# Patient Record
Sex: Female | Born: 2000 | Race: White | Hispanic: No | Marital: Single | Smoking: Never smoker
Health system: Southern US, Community
[De-identification: ages and names within clinical notes are randomized; demographics above are authoritative.]

## PROBLEM LIST (undated history)

## (undated) DIAGNOSIS — F909 Attention-deficit hyperactivity disorder, unspecified type: Secondary | ICD-10-CM

## (undated) DIAGNOSIS — D649 Anemia, unspecified: Secondary | ICD-10-CM

## (undated) HISTORY — PX: MOLE REMOVAL: SHX2046

## (undated) HISTORY — DX: Anemia, unspecified: D64.9

---
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
## 2020-02-11 ENCOUNTER — Other Ambulatory Visit: Payer: Self-pay

## 2020-02-11 DIAGNOSIS — N944 Primary dysmenorrhea: Secondary | ICD-10-CM

## 2020-02-11 DIAGNOSIS — N920 Excessive and frequent menstruation with regular cycle: Secondary | ICD-10-CM

## 2020-02-11 MED ORDER — LO LOESTRIN FE 1 MG-10 MCG / 10 MCG PO TABS: 1.0000 | ORAL_TABLET | Freq: Every day | ORAL | 6 refills | Status: DC

## 2020-03-09 IMAGING — DX DG FOOT COMPLETE 3+V*L*
3 series · 3 of 3 positions shown · non-contrast
Comparison: None.

CLINICAL DATA: Chronic pain

EXAM:
LEFT FOOT - COMPLETE 3+ VIEW

[foot ap]
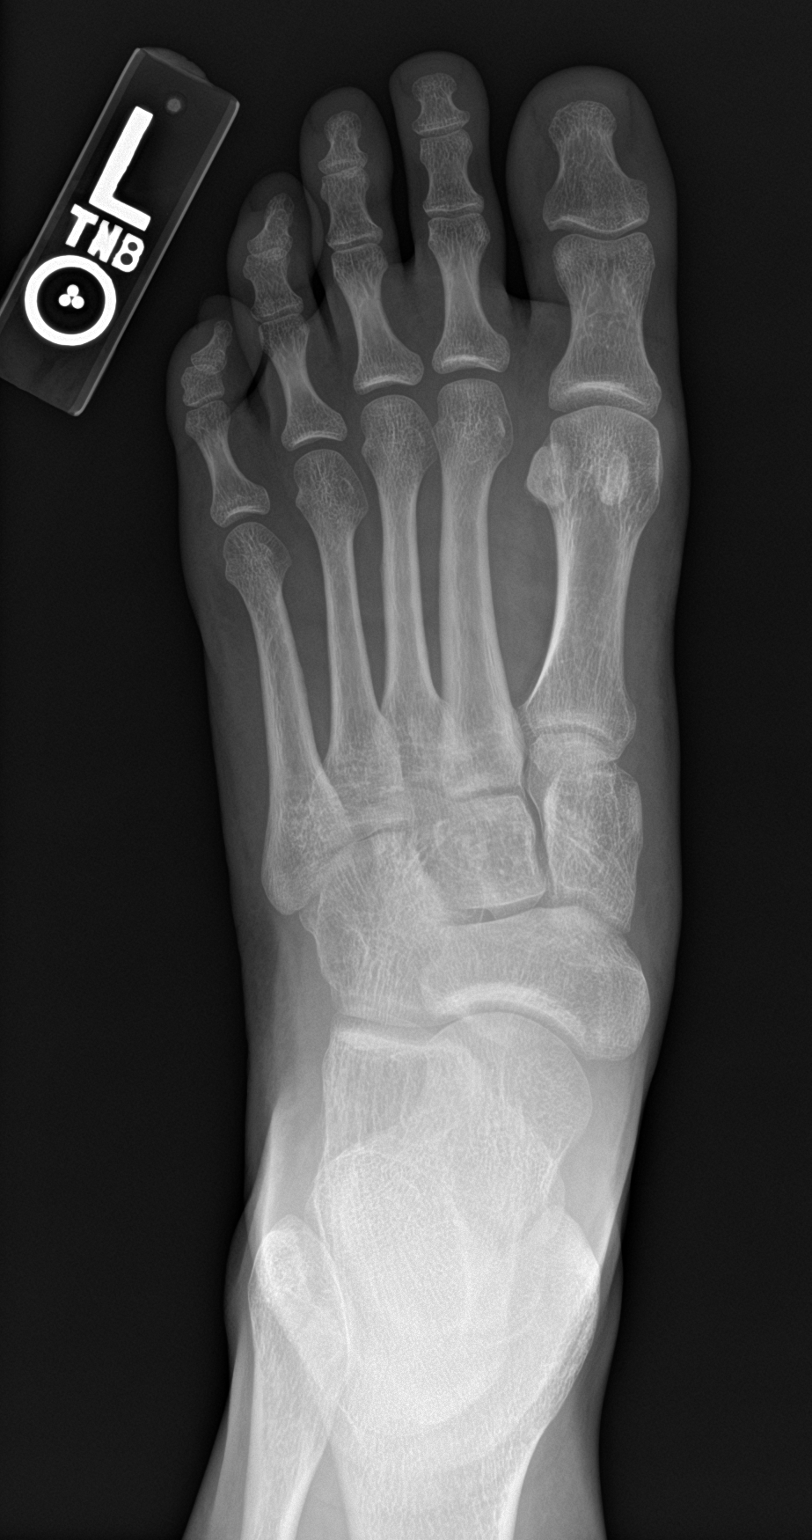

[foot obl]
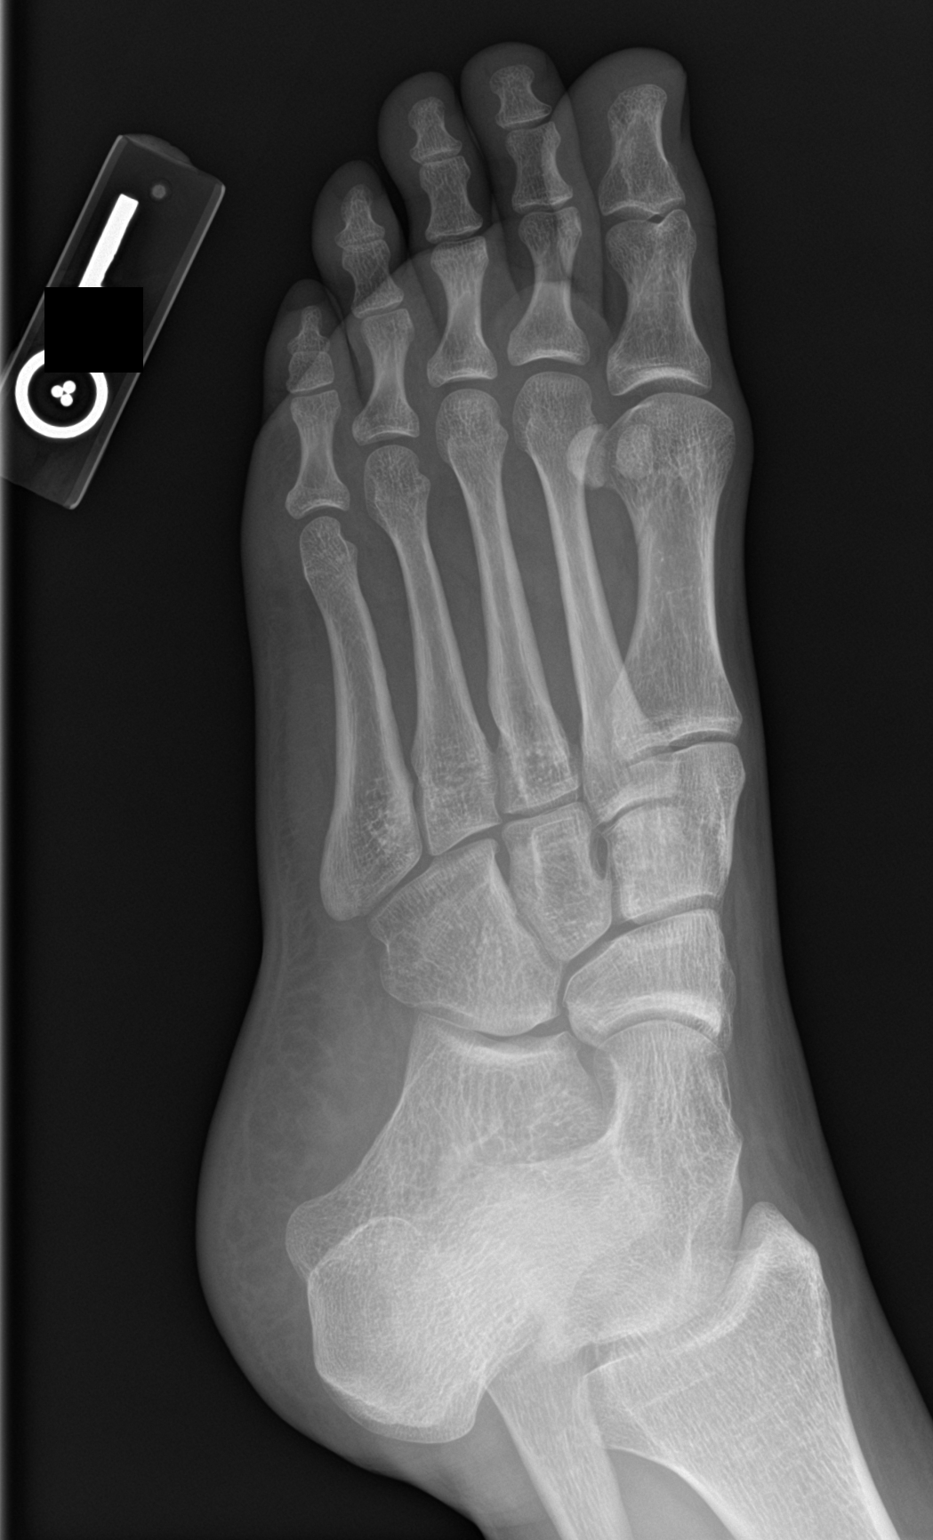

[foot lat]
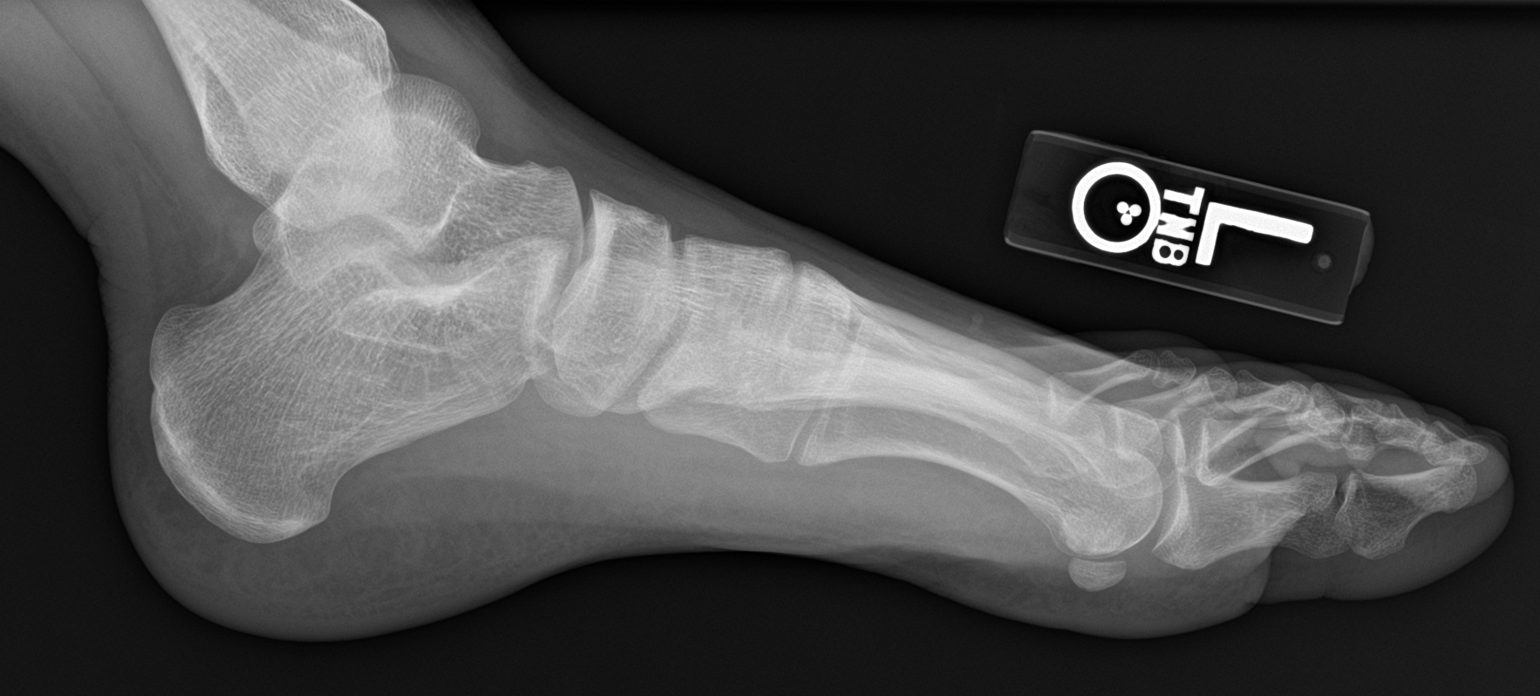

[3 of 3 positions shown; findings below may reference images not displayed]

FINDINGS: Frontal, oblique, and lateral views obtained. No fracture or
dislocation. Joint spaces appear normal. No erosive change.
IMPRESSION: No fracture or dislocation.  No evident arthropathy.

## 2022-10-02 ENCOUNTER — Ambulatory Visit (HOSPITAL_COMMUNITY)
Admission: EM | Admit: 2022-10-02 | Discharge: 2022-10-02 | Disposition: A | Discharge status: Home / Self Care | Payer: BC Managed Care – PPO | Attending: Internal Medicine | Admitting: Internal Medicine

## 2022-10-02 ENCOUNTER — Encounter (HOSPITAL_COMMUNITY): Payer: Self-pay | Admitting: *Deleted

## 2022-10-02 DIAGNOSIS — J029 Acute pharyngitis, unspecified: Secondary | ICD-10-CM | POA: Insufficient documentation

## 2022-10-02 DIAGNOSIS — B349 Viral infection, unspecified: Secondary | ICD-10-CM | POA: Insufficient documentation

## 2022-10-02 LAB — POCT RAPID STREP A (OFFICE): Rapid Strep A Screen: NEGATIVE

## 2022-10-02 MED ORDER — LIDOCAINE VISCOUS HCL 2 % MT SOLN: 15.0000 mL | OROMUCOSAL | 0 refills | Status: DC | PRN

## 2022-10-02 MED ORDER — ACETAMINOPHEN 325 MG PO TABS
650.0000 mg | ORAL_TABLET | Freq: Once | ORAL | Status: AC
  Administered 2022-10-02: 650 mg via ORAL

## 2022-10-02 MED ORDER — IBUPROFEN 800 MG PO TABS: 800.0000 mg | ORAL_TABLET | Freq: Three times a day (TID) | ORAL | 0 refills | Status: DC

## 2022-10-02 MED ORDER — ACETAMINOPHEN 325 MG PO TABS
ORAL_TABLET | ORAL | Status: AC
  Filled 2022-10-02: qty 2

## 2022-10-02 NOTE — ED Provider Notes (Signed)
[facility]    CSN: [id number] Arrival date & time: 10/02/22  1030     History   Chief Complaint Chief Complaint  Patient presents with   Fever   Sore Throat    HPI Erin Hanson is a 22 y.o. female.  3 day history of sore throat, rated 7/10 pain with swallowing  Fever tmax 101 last night Some cough. Denies abd pain, NVD, rash She took tylenol. 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 Authorizing Provider  ibuprofen (ADVIL) 800 MG tablet Take 1 tablet (800 mg total) by mouth 3 (three) times daily. 10/02/22  Yes Kaydan Wong, PA-C  lidocaine (XYLOCAINE) 2 % solution Use as directed 15 mLs in the mouth or throat every 3 (three) hours as needed for mouth pain. Swish/gargle and spit out 10/02/22  Yes Rutherford Alarie, Lurena Joiner, PA-C    Family History Family History  Problem Relation Age of Onset   Healthy Mother    Depression Mother    ADD / ADHD Mother    Anxiety disorder Mother    Healthy Father    OCD Father    Depression Father    Anxiety disorder Father    Bipolar disorder Father    Asthma Father    Skin cancer Father    Skin cancer Paternal Uncle    Skin cancer Paternal Grandfather     Social History Social History   Tobacco Use   Smoking status: Never   Smokeless tobacco: Never  Vaping Use   Vaping  status: Never Used  Substance Use Topics   Alcohol use: Yes    Comment: ocassional   Drug use: Never     Allergies   Lamictal [lamotrigine]   Review of Systems Review of Systems As per HPI  Physical Exam Triage Vital Signs ED Triage Vitals  Encounter Vitals Group     BP 10/02/22 1145 127/84     Systolic BP Percentile --      Diastolic BP Percentile --      Pulse Rate 10/02/22 1145 97     Resp 10/02/22 1145 18     Temp 10/02/22 1145 98.1 F (36.7 C)     Temp Source 10/02/22 1145 Oral     SpO2 10/02/22 1145 99 %     Weight --      Height --      Head Circumference --      Peak Flow --      Pain Score 10/02/22 1143 7     Pain  Loc --      Pain Education --      Exclude from Growth Chart --    No data found.  Updated Vital Signs BP 127/84 (BP Location: Left Arm)   Pulse 97   Temp 98.1 F (36.7 C) (Oral)   Resp 18   LMP 09/17/2022 (Approximate)   SpO2 99%    Physical Exam Vitals and nursing note reviewed.  Constitutional:      General: She is not in acute distress.    Appearance: She is not ill-appearing.     Comments: Normal phonation, tolerates secretions   HENT:     Nose: No congestion or rhinorrhea.     Mouth/Throat:     Mouth: Mucous membranes are moist.     Pharynx: Oropharynx is clear. Uvula midline. Posterior oropharyngeal erythema present. No uvula swelling.     Tonsils: Tonsillar exudate (minimal) present. No tonsillar abscesses. 1+ on the right. 1+ on the left.     Comments: Very small tonsils. 1 or 2 spots of exudate on each side. No uvula swelling Eyes:     Conjunctiva/sclera: Conjunctivae normal.  Cardiovascular:     Rate and Rhythm: Normal rate and regular rhythm.     Heart sounds: Normal heart sounds.  Pulmonary:     Effort: Pulmonary effort is normal.     Breath sounds: Normal breath sounds.  Musculoskeletal:     Cervical back: Normal range of motion. No rigidity.  Lymphadenopathy:     Cervical: No cervical adenopathy.  Skin:     General: Skin is warm and dry.  Neurological:     Mental Status: She is alert and oriented to person, place, and time.    UC Treatments / Results  Labs (all labs ordered are listed, but only abnormal results are displayed) Labs Reviewed  CULTURE, GROUP A STREP [facility])  POCT RAPID STREP A (OFFICE)    EKG  Radiology No results found.  Procedures Procedures (including critical care time)  Medications Ordered in UC Medications  acetaminophen (TYLENOL) tablet 650 mg (650 mg Oral Given 10/02/22 1209)    Initial Impression / Assessment and Plan / UC Course  I have reviewed the triage vital signs and the nursing notes.  Pertinent labs & imaging results that were available during my care of the patient were reviewed by me and considered in my medical decision making (see chart for details).  Afebrile, well appearing  Rapid strep negative. Difficult swab to obtain; patient very uncomfortable with test. Unclear if good enough sample obtained. Will culture. Reports history of mono and will not test today.  Tylenol dose given for pain Advised tylenol and ibuprofen alternated, lidocaine gargles. Return and ED precautions. Work and school notes provided. Patient agreeable to plan  Final Clinical Impressions(s) / UC Diagnoses   Final diagnoses:  Sore throat  Viral illness     Discharge Instructions      The rapid strep test today was negative.  We will call you if anything grows on culture and requires antibiotics.  This can take 2 to 3 days.  I recommend alternating between Tylenol and ibuprofen for symptoms in the meantime.  You can also try throat lozenges.  Make sure you are drinking lots of fluids! The lidocaine can be used as a gargle and spit to numb the throat.     ED Prescriptions     Medication Sig Dispense Auth. Provider   ibuprofen (ADVIL) 800 MG tablet Take 1 tablet (800 mg total) by  mouth 3 (three) times daily. 21 tablet Omaree Fuqua, PA-C   lidocaine  (XYLOCAINE) 2 % solution Use as directed 15 mLs in the mouth or throat every 3 (three) hours as needed for mouth pain. Swish/gargle and spit out 100 mL Joli Koob, Lurena Joiner, PA-C      PDMP not reviewed this encounter.   Phillip Sandler, Ray Church 10/02/22 1248

## 2022-10-02 NOTE — Discharge Instructions (Addendum)
The rapid strep test today was negative.  We will call you if anything grows on culture and requires antibiotics.  This can take 2 to 3 days.  I recommend alternating between Tylenol and ibuprofen for symptoms in the meantime.  You can also try throat lozenges.  Make sure you are drinking lots of fluids! The lidocaine can be used as a gargle and spit to numb the throat.

## 2022-10-02 NOTE — ED Triage Notes (Signed)
Pt states she has sore throat and a fever (101) at home, since last Thursday. She states she has been taking tylenol

## 2022-10-03 ENCOUNTER — Ambulatory Visit (HOSPITAL_COMMUNITY): Payer: Self-pay

## 2022-10-05 LAB — CULTURE, GROUP A STREP (THRC)

## 2022-10-13 ENCOUNTER — Telehealth: Payer: Self-pay

## 2022-10-13 MED ORDER — AMOXICILLIN 500 MG PO CAPS: 500.0000 mg | ORAL_CAPSULE | Freq: Two times a day (BID) | ORAL | 0 refills | Status: AC

## 2022-10-13 NOTE — Telephone Encounter (Signed)
Received VM from pt stating she was still experiencing sore throat symptoms.  VO per Helaine Chess, PA-C for Amoxicillin 500mg  BID x10 days. Attempted to reach patient x1. LVM. Rx sent to pharmacy on file.

## 2022-12-06 ENCOUNTER — Encounter: Payer: Self-pay | Admitting: Family

## 2022-12-06 ENCOUNTER — Ambulatory Visit (INDEPENDENT_AMBULATORY_CARE_PROVIDER_SITE_OTHER): Payer: Medicaid Other | Admitting: Family

## 2022-12-06 VITALS — BP 137/84 | HR 99 | Temp 98.2°F | Ht 58.5 in | Wt 139.4 lb

## 2022-12-06 DIAGNOSIS — Z0000 Encounter for general adult medical examination without abnormal findings: Secondary | ICD-10-CM

## 2022-12-06 DIAGNOSIS — Z131 Encounter for screening for diabetes mellitus: Secondary | ICD-10-CM

## 2022-12-06 DIAGNOSIS — Z130 Encounter for screening for diseases of the blood and blood-forming organs and certain disorders involving the immune mechanism: Secondary | ICD-10-CM

## 2022-12-06 DIAGNOSIS — Z13228 Encounter for screening for other metabolic disorders: Secondary | ICD-10-CM

## 2022-12-06 DIAGNOSIS — Z114 Encounter for screening for human immunodeficiency virus [HIV]: Secondary | ICD-10-CM

## 2022-12-06 DIAGNOSIS — Z124 Encounter for screening for malignant neoplasm of cervix: Secondary | ICD-10-CM

## 2022-12-06 DIAGNOSIS — Z13220 Encounter for screening for lipoid disorders: Secondary | ICD-10-CM

## 2022-12-06 DIAGNOSIS — Z1159 Encounter for screening for other viral diseases: Secondary | ICD-10-CM

## 2022-12-06 DIAGNOSIS — Z1329 Encounter for screening for other suspected endocrine disorder: Secondary | ICD-10-CM

## 2022-12-06 DIAGNOSIS — Z113 Encounter for screening for infections with a predominantly sexual mode of transmission: Secondary | ICD-10-CM

## 2022-12-06 DIAGNOSIS — Z7689 Persons encountering health services in other specified circumstances: Secondary | ICD-10-CM | POA: Diagnosis not present

## 2022-12-06 NOTE — Progress Notes (Signed)
Patient states no concerns.  Patient declined Flu vaccine.

## 2022-12-06 NOTE — Progress Notes (Signed)
Subjective:    Erin Hanson - 22 y.o. female MRN [id number]  Date of birth: December 16, 2000  HPI  Erin Hanson is to establish care and annual physical exam.    Current issues and/or concerns: - Requests referral to Gynecology for women's health maintenance/screenings.  - No issues/concerns for discussion today.   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 She has never used smokeless tobacco. She reports current alcohol use. She reports that she does not use drugs.   Family History  family history includes ADD / ADHD in her mother; Anxiety disorder in her father and mother; Asthma in her father; Bipolar disorder in her father; Depression in her father and mother; Healthy in her father and mother; OCD in her father; Skin cancer in her father, paternal grandfather, and paternal uncle.   Medications: reviewed and updated   Objective:   Physical Exam BP 137/84   Pulse 99   Temp 98.2 F (36.8 C) (Oral)   Ht 4' 10.5" (1.486 m)   Wt 139 lb 6.4 oz (63.2 kg)   LMP 11/10/2022 (Exact Date)   SpO2 99%   BMI 28.64 kg/m   Physical Exam HENT:     Head: Normocephalic and atraumatic.     Right Ear: Tympanic membrane, ear canal and external ear normal.     Left Ear: Tympanic membrane, ear canal and external ear normal.     Nose: Nose normal.     Mouth/Throat:     Mouth: Mucous membranes  are moist.     Pharynx: Oropharynx is clear.  Eyes:     Extraocular Movements: Extraocular movements intact.     Conjunctiva/sclera: Conjunctivae normal.     Pupils: Pupils are equal, round, and reactive to light.  Neck:     Thyroid: No thyroid mass, thyromegaly or thyroid tenderness.  Cardiovascular:     Rate and Rhythm: Normal rate and regular rhythm.     Pulses: Normal pulses.     Heart sounds: Normal heart sounds.  Pulmonary:     Effort: Pulmonary effort is normal.     Breath sounds: Normal breath sounds.  Chest:     Comments: Patient declined.  Abdominal:     General: Bowel sounds are normal.     Palpations: Abdomen is soft.  Genitourinary:    Comments: Patient declined.  Musculoskeletal:        General: Normal range of motion.     Right shoulder: Normal.     Left shoulder: Normal.     Right upper arm: Normal.     Left upper arm: Normal.     Right elbow: Normal.     Left elbow: Normal.     Right forearm: Normal.     Left forearm: Normal.     Right wrist: Normal.  Left wrist: Normal.     Right hand: Normal.     Left hand: Normal.     Cervical back: Normal, normal range of motion and neck supple.     Thoracic back: Normal.     Lumbar back: Normal.     Right hip: Normal.     Left hip: Normal.     Right upper leg: Normal.     Left upper leg: Normal.     Right knee: Normal.     Left knee: Normal.     Right lower leg: Normal.     Left lower leg: Normal.     Right ankle: Normal.     Left ankle: Normal.     Right foot: Normal.     Left foot: Normal.  Skin:    General: Skin is warm and dry.     Capillary Refill: Capillary refill takes less than 2 seconds.  Neurological:     General: No focal deficit present.     Mental Status: She is alert and oriented to person, place, and time.  Psychiatric:        Mood and Affect: Mood normal.        Behavior: Behavior normal.         Assessment & Plan:  1. Encounter to establish care 2. Annual physical exam -  Counseled on 150 minutes of exercise per week as tolerated, healthy eating (including decreased daily intake of saturated fats, cholesterol, added sugars, sodium), STI prevention, and routine healthcare maintenance.  3. Screening for metabolic disorder - Routine screening.  - CMP14+EGFR  4. Screening for deficiency anemia - Routine screening.  - CBC  5. Diabetes mellitus screening - Routine screening.  - Hemoglobin A1c  6. Screening cholesterol level - Routine screening.  - Lipid panel  7. Thyroid disorder screen - Routine screening.  - TSH  8. Encounter for screening for HIV - Routine screening.  - HIV antibody (with reflex)  9. Need for hepatitis C screening test - Routine screening.  - Hepatitis C Antibody  10. Cervical cancer screening 11. Routine screening for STI (sexually transmitted infection) - Referral to Gynecology for evaluation/management. - Ambulatory referral to Gynecology    Patient was given clear instructions to go to Emergency Department or return to medical center if symptoms don't improve, worsen, or new problems develop.The patient verbalized understanding.  I discussed the assessment and treatment plan with the patient. The patient was provided an opportunity to ask questions and all were answered. The patient agreed with the plan and demonstrated an understanding of the instructions.   The patient was advised to call back or seek an in-person evaluation if the symptoms worsen or if the condition fails to improve as anticipated.    Ricky Stabs, NP 12/06/2022, 3:24 PM Primary Care at [facility]

## 2022-12-07 ENCOUNTER — Other Ambulatory Visit: Payer: Self-pay | Admitting: Family

## 2022-12-07 DIAGNOSIS — Z13220 Encounter for screening for lipoid disorders: Secondary | ICD-10-CM

## 2022-12-07 LAB — CMP14+EGFR
ALT: 14 [IU]/L (ref 0–32)
AST: 18 [IU]/L (ref 0–40)
Albumin: 4.7 g/dL (ref 4.0–5.0)
Alkaline Phosphatase: 80 [IU]/L (ref 44–121)
BUN/Creatinine Ratio: 16 (ref 9–23)
BUN: 9 mg/dL (ref 6–20)
Bilirubin Total: 0.3 mg/dL (ref 0.0–1.2)
CO2: 20 mmol/L (ref 20–29)
Calcium: 9.8 mg/dL (ref 8.7–10.2)
Chloride: 102 mmol/L (ref 96–106)
Creatinine, Ser: 0.57 mg/dL (ref 0.57–1.00)
Globulin, Total: 2.6 g/dL (ref 1.5–4.5)
Glucose: 86 mg/dL (ref 70–99)
Potassium: 4.1 mmol/L (ref 3.5–5.2)
Sodium: 139 mmol/L (ref 134–144)
Total Protein: 7.3 g/dL (ref 6.0–8.5)
eGFR: 132 mL/min/{1.73_m2} (ref >59)

## 2022-12-07 LAB — CBC
Hematocrit: 42.6 % (ref 34.0–46.6)
Hemoglobin: 13.6 g/dL (ref 11.1–15.9)
MCH: 29.5 pg (ref 26.6–33.0)
MCHC: 31.9 g/dL (ref 31.5–35.7)
MCV: 92 fL (ref 79–97)
Platelets: 331 10*3/uL (ref 150–450)
RBC: 4.61 x10E6/uL (ref 3.77–5.28)
RDW: 13.4 % (ref 11.7–15.4)
WBC: 8.8 10*3/uL (ref 3.4–10.8)

## 2022-12-07 LAB — LIPID PANEL
Chol/HDL Ratio: 2.6 ratio (ref 0.0–4.4)
Cholesterol, Total: 189 mg/dL (ref 100–199)
HDL: 72 mg/dL (ref >39)
LDL Chol Calc (NIH): 102 mg/dL — ABNORMAL HIGH (ref 0–99)
Triglycerides: 81 mg/dL (ref 0–149)
VLDL Cholesterol Cal: 15 mg/dL (ref 5–40)

## 2022-12-07 LAB — HEMOGLOBIN A1C
Est. average glucose Bld gHb Est-mCnc: 111 mg/dL
Hgb A1c MFr Bld: 5.5 % (ref 4.8–5.6)

## 2022-12-07 LAB — TSH: TSH: 1.48 u[IU]/mL (ref 0.450–4.500)

## 2022-12-07 LAB — HIV ANTIBODY (ROUTINE TESTING W REFLEX): HIV Screen 4th Generation wRfx: NONREACTIVE

## 2022-12-07 LAB — HEPATITIS C ANTIBODY: Hep C Virus Ab: NONREACTIVE

## 2022-12-21 ENCOUNTER — Encounter: Payer: Self-pay | Admitting: Psychiatry

## 2022-12-28 ENCOUNTER — Ambulatory Visit: Payer: BC Managed Care – PPO | Admitting: Family

## 2023-02-10 ENCOUNTER — Ambulatory Visit (HOSPITAL_COMMUNITY)
Admission: EM | Admit: 2023-02-10 | Discharge: 2023-02-10 | Disposition: A | Discharge status: Home / Self Care | Payer: Medicaid Other | Attending: Internal Medicine | Admitting: Internal Medicine

## 2023-02-10 ENCOUNTER — Encounter (HOSPITAL_COMMUNITY): Payer: Self-pay

## 2023-02-10 DIAGNOSIS — S0003XA Contusion of scalp, initial encounter: Secondary | ICD-10-CM | POA: Diagnosis not present

## 2023-02-10 DIAGNOSIS — M7918 Myalgia, other site: Secondary | ICD-10-CM | POA: Diagnosis not present

## 2023-02-10 MED ORDER — IBUPROFEN 600 MG PO TABS: 600.0000 mg | ORAL_TABLET | Freq: Four times a day (QID) | ORAL | 0 refills | Status: DC | PRN

## 2023-02-10 NOTE — ED Triage Notes (Signed)
 Patient here today with c/o headache and a lump on the top of her head. Patient states that she attempted to do a pull up on a door pull up bar and after getting down, the bar fell on top of her head. This happened on 02/07/2023. Patient is concerned with a concussion. No LOC. Patient states that she did feel dizzy and disoriented after the incident. Patient also states that she has also had difficulty focusing with her vision.

## 2023-02-10 NOTE — Discharge Instructions (Signed)
 Gentle range of motion exercises of your right upper extremity. Please apply heating pad to the right upper back and a 20-minute on-20 minutes off cycle Please take ibuprofen  as needed for pain If you have worsening symptoms please return to urgent care to be reevaluated.  Please apply heating pad to your scalp Take ibuprofen  as needed for pain Your headache is likely related to your scalp pain. The symptoms you describe is not typical for a concussion If you have worsening headaches, confusion, persistent blurry vision please feel free to return to urgent care to be reevaluated.

## 2023-02-13 NOTE — ED Provider Notes (Signed)
 [facility]    CSN: [id number] Arrival date & time: 02/10/23  1620      History   Chief Complaint Chief Complaint  Patient presents with   Head Injury    HPI Erin Hanson is a 23 y.o. female comes to the urgent care with scalp pain of 4 days duration.  Patient sustained head injury when the pull-up bar fell on top of her head.  This happened 4 days ago.  She denies any loss of consciousness at that time.  The polar bar was a couple of feet above her head.  She was a little disoriented when that happened.  She denied any blurry vision.  She continues to have a headache mainly in the occipital area and also has scalp pain on top of her head.  Pain is of moderate severity, throbbing and aggravated to touch.  No neck pain.  No neck stiffness.  No nausea or vomiting.  No blurry vision or lightheadedness.  Patient complains of right upper back pain.  Right upper back pain.  Right upper quadrant.  Has been present for several months.  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  Back pain is throbbing and associated with muscle stiffness.  No known relieving factors.  She has not tried any over-the-counter medication.  No bruising or swelling in the right upper back.  No radiation of pain.   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 Authorizing Provider  ibuprofen  (ADVIL ) 600 MG tablet  Take 1 tablet (600 mg total) by mouth every 6 (six) hours as needed. 02/10/23  Yes Treanna Dumler, Aleene KIDD, MD    Family History Family History  Problem Relation Age of Onset   Healthy Mother    Depression Mother    ADD / ADHD Mother    Anxiety disorder Mother    Healthy Father    OCD Father    Depression Father    Anxiety disorder Father    Bipolar disorder Father    Asthma Father    Skin cancer Father    Skin cancer Paternal Uncle    Skin cancer Paternal Grandfather     Social History Social History   Tobacco Use   Smoking status: Never   Smokeless tobacco: Never  Vaping Use   Vaping status: Never Used  Substance Use Topics   Alcohol use: Yes    Comment: ocassional   Drug use: Never     Allergies   Lamictal  [lamotrigine ]   Review of Systems Review of Systems As per HPI  Physical Exam Triage Vital Signs ED Triage  Vitals  Encounter Vitals Group     BP 02/10/23 1757 124/85     Systolic BP Percentile --      Diastolic BP Percentile --      Pulse Rate 02/10/23 1757 79     Resp 02/10/23 1757 16     Temp 02/10/23 1757 98.9 F (37.2 C)     Temp Source 02/10/23 1757 Oral     SpO2 02/10/23 1757 98 %     Weight 02/10/23 1757 137 lb (62.1 kg)     Height 02/10/23 1757 4' 11 (1.499 m)     Head Circumference --      Peak Flow --      Pain Score 02/10/23 1756 5     Pain Loc --      Pain Education --      Exclude from Growth Chart --    No data found.  Updated Vital Signs BP 124/85 (BP Location: Right Arm)   Pulse 79   Temp 98.9 F (37.2 C) (Oral)   Resp 16   Ht 4' 11 (1.499 m)   Wt 62.1 kg   LMP 02/02/2023 (Exact Date)   SpO2 98%   BMI 27.67 kg/m   Visual Acuity Right Eye Distance:   Left Eye Distance:   Bilateral Distance:    Right Eye Near:   Left Eye Near:    Bilateral Near:     Physical Exam Vitals and nursing note reviewed.  Constitutional:      General: She is not in acute distress.    Appearance: She is not ill-appearing.  HENT:      Right Ear: Tympanic membrane normal.     Left Ear: Tympanic membrane normal.  Eyes:     General:        Right eye: No discharge.        Left eye: No discharge.     Extraocular Movements: Extraocular movements intact.     Pupils: Pupils are equal, round, and reactive to light.  Cardiovascular:     Rate and Rhythm: Normal rate and regular rhythm.     Pulses: Normal pulses.     Heart sounds: Normal heart sounds.  Pulmonary:     Effort: Pulmonary effort is normal.     Breath sounds: Normal breath sounds.  Abdominal:     General: Bowel sounds are normal.     Palpations: Abdomen is soft.  Neurological:     General: No focal deficit present.     Mental Status: She is alert and oriented to person, place, and time.     Cranial Nerves: No cranial nerve deficit.     Sensory: No sensory deficit.     Motor: No weakness.     Coordination: Coordination normal.     Comments: Scalp tenderness on the vertex of the scalp.  No hematoma or bruising noted.  Psychiatric:        Mood and Affect: Mood normal.        Behavior: Behavior normal.      UC Treatments / Results  Labs (all labs ordered are listed, but only abnormal results are displayed) Labs Reviewed - No data to display  EKG   Radiology No results found.  Procedures Procedures (including critical care time)  Medications Ordered in UC Medications - No data to display  Initial Impression / Assessment and Plan / UC Course  I have reviewed the triage vital signs and the nursing notes.  Pertinent labs & imaging results that  were available during my care of the patient were reviewed by me and considered in my medical decision making (see chart for details).     1.  Contusion of the scalp with headache: Warm compress as needed Tylenol /ibuprofen  as needed for pain Gentle stretching exercises Return precautions given  2.  Paraspinal muscle pain in the right upper back: Gentle stretching exercises warm compresses Pain  medication as above Please return to urgent care if you have worsening symptoms. Final Clinical Impressions(s) / UC Diagnoses   Final diagnoses:  Contusion of scalp, initial encounter  Pain of paraspinal muscle     Discharge Instructions      Gentle range of motion exercises of your right upper extremity. Please apply heating pad to the right upper back and a 20-minute on-20 minutes off cycle Please take ibuprofen  as needed for pain If you have worsening symptoms please return to urgent care to be reevaluated.  Please apply heating pad to your scalp Take ibuprofen  as needed for pain Your headache is likely related to your scalp pain. The symptoms you describe is not typical for a concussion If you have worsening headaches, confusion, persistent blurry vision please feel free to return to urgent care to be reevaluated.   ED Prescriptions     Medication Sig Dispense Auth. Provider   ibuprofen  (ADVIL ) 600 MG tablet Take 1 tablet (600 mg total) by mouth every 6 (six) hours as needed. 30 tablet Amadou Katzenstein, Aleene KIDD, MD      PDMP not reviewed this encounter.   Blaise Aleene KIDD, MD 02/13/23 1415

## 2023-02-15 ENCOUNTER — Ambulatory Visit: Payer: Medicaid Other | Admitting: Radiology

## 2023-02-15 ENCOUNTER — Other Ambulatory Visit (HOSPITAL_COMMUNITY)
Admission: RE | Admit: 2023-02-15 | Discharge: 2023-02-15 | Disposition: A | Discharge status: Home / Self Care | Payer: Medicaid Other | Source: Ambulatory Visit | Attending: Radiology | Admitting: Radiology

## 2023-02-15 ENCOUNTER — Encounter: Payer: Self-pay | Admitting: Radiology

## 2023-02-15 VITALS — BP 128/80 | HR 108 | Ht 59.25 in | Wt 139.0 lb

## 2023-02-15 DIAGNOSIS — Z01419 Encounter for gynecological examination (general) (routine) without abnormal findings: Secondary | ICD-10-CM

## 2023-02-15 NOTE — Progress Notes (Signed)
 Erin Hanson 25-Aug-2000 [id number]   History:  23 y.o. G0 presents for annual exam as a new patient. No gyn concerns. Has a PCP. Exercises regularly. No sperm exposure, no need for [facility].  Gynecologic History Patient's last menstrual period was 02/02/2023 (exact date). Period Cycle (Days): 28 Period Duration (Days): 4 Period Pattern: Regular Menstrual Flow: Moderate Menstrual Control: Thin pad, Maxi pad, Tampon Dysmenorrhea: (!) Moderate Dysmenorrhea Symptoms: Cramping Contraception/Family planning:  partner is a transgender man Sexually active: yes   Obstetric History OB History  Gravida Para Term Preterm AB Living  0 0 0 0 0 0  SAB IAB Ectopic Multiple Live Births  0 0 0 0 0    The following portions of the patient's history were reviewed and updated as appropriate: allergies, current medications, past family history, past medical history, past social history, past surgical history, and problem list.  Review of Systems  All other systems reviewed and are negative.   Past medical history, past surgical history, family history and social history were all reviewed and documented in the EPIC chart.  Exam:  Vitals:   02/15/23 1411  BP: 128/80  Pulse: (!) 108  SpO2: 96%  Weight: 139 lb (63 kg)  Height: 4' 11.25 (1.505 m)   Body mass index is 27.84 kg/m.  Physical Exam Vitals and nursing note reviewed. Exam conducted with a chaperone present.  Constitutional:      Appearance: Normal appearance. She is normal weight.  HENT:     Head: Normocephalic and atraumatic.  Neck:     Thyroid: No thyroid mass, thyromegaly or thyroid tenderness.  Cardiovascular:     Rate and Rhythm: Regular rhythm.     Heart sounds: Normal heart sounds.  Pulmonary:     Effort: Pulmonary effort is normal.     Breath sounds: Normal breath sounds.  Chest:  Breasts:    Breasts are symmetrical.     Right: Normal. No inverted nipple, mass, nipple discharge, skin change or tenderness.      Left: Normal. No inverted nipple, mass, nipple discharge, skin change or tenderness.  Abdominal:     General: Abdomen is flat. Bowel sounds are normal.     Palpations: Abdomen is soft.  Genitourinary:    General: Normal vulva.     Vagina: Normal. No vaginal discharge, bleeding or lesions.     Cervix: Normal. No discharge or lesion.     Uterus: Normal. Not enlarged and not tender.      Adnexa: Right adnexa normal and left adnexa normal.       Right: No mass, tenderness or fullness.         Left: No mass, tenderness or fullness.    Lymphadenopathy:     Upper Body:     Right upper body: No axillary adenopathy.     Left upper body: No axillary adenopathy.  Skin:    General: Skin is warm and dry.  Neurological:     Mental Status: She is alert and oriented to person, place, and time.  Psychiatric:        Mood and Affect: Mood normal.        Thought Content: Thought content normal.        Judgment: Judgment normal.      Darice Hoit, CMA present for exam  Assessment/Plan:   1. Well woman exam with routine gynecological exam (Primary) - Cytology - PAP( [facility])    Discussed SBE, pap and STI screening as directed/appropriate. Recommend of exercise  weekly, including weight bearing exercise.   Return in about 1 year (around 02/15/2024) for Annual.  Deamber Buckhalter B WHNP-BC 2:35 PM 02/15/2023

## 2023-02-15 NOTE — Patient Instructions (Signed)

## 2023-02-16 LAB — CYTOLOGY - PAP: Diagnosis: NEGATIVE

## 2023-05-30 ENCOUNTER — Other Ambulatory Visit: Payer: Self-pay

## 2023-05-30 ENCOUNTER — Other Ambulatory Visit (HOSPITAL_COMMUNITY): Payer: Self-pay

## 2023-05-30 MED ORDER — METHYLPHENIDATE HCL ER (OSM) 18 MG PO TBCR
18.0000 mg | EXTENDED_RELEASE_TABLET | Freq: Every morning | ORAL | 0 refills | Status: AC
  Filled 2023-05-30: qty 14, 14d supply, fill #0

## 2023-06-14 ENCOUNTER — Other Ambulatory Visit (HOSPITAL_COMMUNITY): Payer: Self-pay

## 2023-06-14 MED ORDER — METHYLPHENIDATE HCL ER (OSM) 27 MG PO TBCR
27.0000 mg | EXTENDED_RELEASE_TABLET | Freq: Every day | ORAL | 0 refills | Status: AC
  Filled 2023-06-14: qty 30, 30d supply, fill #0

## 2023-06-15 ENCOUNTER — Other Ambulatory Visit (HOSPITAL_COMMUNITY): Payer: Self-pay

## 2023-07-19 ENCOUNTER — Other Ambulatory Visit: Payer: Self-pay

## 2023-07-19 ENCOUNTER — Other Ambulatory Visit (HOSPITAL_COMMUNITY): Payer: Self-pay

## 2023-07-19 MED ORDER — METHYLPHENIDATE HCL ER (OSM) 27 MG PO TBCR
27.0000 mg | EXTENDED_RELEASE_TABLET | Freq: Every day | ORAL | 0 refills | Status: AC
  Filled 2023-07-19 – 2023-08-01 (×3): qty 30, 30d supply, fill #0

## 2023-07-19 MED ORDER — METHYLPHENIDATE HCL ER (OSM) 27 MG PO TBCR
27.0000 mg | EXTENDED_RELEASE_TABLET | Freq: Every day | ORAL | 0 refills | Status: AC
  Filled 2023-10-05: qty 30, 30d supply, fill #0

## 2023-07-19 MED ORDER — METHYLPHENIDATE HCL ER (OSM) 27 MG PO TBCR: 27.0000 mg | EXTENDED_RELEASE_TABLET | Freq: Every day | ORAL | 0 refills | Status: AC

## 2023-07-31 ENCOUNTER — Other Ambulatory Visit (HOSPITAL_COMMUNITY): Payer: Self-pay

## 2023-08-01 ENCOUNTER — Other Ambulatory Visit (HOSPITAL_COMMUNITY): Payer: Self-pay

## 2023-09-15 ENCOUNTER — Ambulatory Visit (HOSPITAL_COMMUNITY)
Admission: RE | Admit: 2023-09-15 | Discharge: 2023-09-15 | Disposition: A | Discharge status: Home / Self Care | Payer: Self-pay | Source: Ambulatory Visit | Attending: Family Medicine | Admitting: Family Medicine

## 2023-09-15 ENCOUNTER — Encounter (HOSPITAL_COMMUNITY): Payer: Self-pay

## 2023-09-15 ENCOUNTER — Ambulatory Visit (INDEPENDENT_AMBULATORY_CARE_PROVIDER_SITE_OTHER)

## 2023-09-15 VITALS — BP 119/79 | HR 73 | Temp 98.0°F | Resp 14

## 2023-09-15 DIAGNOSIS — M79645 Pain in left finger(s): Secondary | ICD-10-CM

## 2023-09-15 DIAGNOSIS — S62659A Nondisplaced fracture of medial phalanx of unspecified finger, initial encounter for closed fracture: Secondary | ICD-10-CM

## 2023-09-15 HISTORY — DX: Attention-deficit hyperactivity disorder, unspecified type: F90.9

## 2023-09-15 NOTE — ED Triage Notes (Signed)
 Pt [place] in the park [place] and [place] injuring left 3rd and 4th fingers. Pt has bruising and swelling to both fingers. Pt reports very painful when trying to bend her fingers. Pt has fingers buddy taped together upon triage.  Pt reports she did ice and elevate last night.

## 2023-09-15 NOTE — ED Provider Notes (Signed)
 [facility]    CSN: [id number] Arrival date & time: 09/15/23  1000      History   Chief Complaint Chief Complaint  Patient presents with   Finger Injury    Appt 1000    HPI Erin Hanson is a 23 y.o. female.   HPI Here for pain in her 3rd and 4th digits of the left hand.  Last night she was exiting a stage at play practice when she slipped and fell onto her left hand and hyperextended her fingers of the left hand.  Since then she has pain and bruising around the PIP joints of her 3rd and 4th digits.  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 Authorizing Provider  ibuprofen  (ADVIL ) 600 MG tablet Take 1 tablet (600 mg total) by mouth every 6 (six) hours as needed. 02/10/23   Blaise Aleene KIDD, MD  methylphenidate  (CONCERTA ) 18 MG PO CR tablet Take 1 tablet (18 mg total) by mouth in the morning. 05/30/23     methylphenidate  (CONCERTA ) 27 MG PO CR tablet Take 1 tablet (27 mg total) by mouth daily. 06/14/23     methylphenidate  (CONCERTA ) 27 MG PO CR tablet Take 1 tablet (27 mg total) by mouth daily. 09/13/23     methylphenidate  (CONCERTA ) 27 MG PO CR tablet Take 1 tablet (27 mg total) by mouth daily. 07/19/23     methylphenidate  (CONCERTA ) 27 MG PO CR tablet Take 1 tablet (27 mg total) by mouth daily. 08/16/23       Family History Family  History  Problem Relation Age of Onset   Healthy Mother    Depression Mother    ADD / ADHD Mother    Anxiety disorder Mother    Parkinson's disease Mother    Healthy Father    OCD Father    Depression Father    Anxiety disorder Father    Bipolar disorder Father    Asthma Father    Skin cancer Father    Skin cancer Paternal Uncle    Skin cancer Paternal Grandfather     Social History Social History   Tobacco Use   Smoking status: Never    Passive exposure: Never   Smokeless tobacco: Never  Vaping Use   Vaping status: Never Used  Substance Use Topics   Alcohol use: Yes    Comment: ocassional   Drug use: Never     Allergies   Lamictal  [lamotrigine ]   Review of Systems Review of Systems   Physical Exam Triage  Vital Signs ED Triage Vitals  Encounter Vitals Group     BP 09/15/23 1024 119/79     Girls Systolic BP Percentile --      Girls Diastolic BP Percentile --      Boys Systolic BP Percentile --      Boys Diastolic BP Percentile --      Pulse Rate 09/15/23 1024 73     Resp 09/15/23 1024 14     Temp 09/15/23 1024 98 F (36.7 C)     Temp Source 09/15/23 1024 Oral     SpO2 09/15/23 1024 99 %     Weight --      Height --      Head Circumference --      Peak Flow --      Pain Score 09/15/23 1022 8     Pain Loc --      Pain Education --      Exclude from Growth Chart --    No data found.  Updated Vital Signs BP 119/79 (BP Location: Right Arm)   Pulse 73   Temp 98 F (36.7 C) (Oral)   Resp 14   LMP 09/05/2023 (Approximate)   SpO2 99%   Visual Acuity Right Eye Distance:   Left Eye Distance:   Bilateral Distance:    Right Eye Near:   Left Eye Near:    Bilateral Near:     Physical Exam Vitals reviewed.  Constitutional:      General: She is not in acute distress.    Appearance: She is not ill-appearing, toxic-appearing or diaphoretic.  Musculoskeletal:     Comments: There is bruising over the palmar surface of the PIPs of her 3rd and 4th  digits of the left hand.  She can flex and extend a little bit but it is painful.  Sensation is intact over the fingerpads and capillary refill is normal.  Skin:    Coloration: Skin is not pale.  Neurological:     Mental Status: She is alert and oriented to person, place, and time.  Psychiatric:        Behavior: Behavior normal.      UC Treatments / Results  Labs (all labs ordered are listed, but only abnormal results are displayed) Labs Reviewed - No data to display  EKG   Radiology DG Hand Complete Left Result Date: 09/15/2023 CLINICAL DATA:  Pain of the left third and fourth digits after fall. EXAM: LEFT HAND - COMPLETE 3+ VIEW COMPARISON:  None Available. FINDINGS: Minimally distracted volar plate avulsion fractures of the bases of the third and fourth middle phalanges. No dislocation. Soft tissue swelling of the proximal to mid third and fourth digits. No radiopaque foreign body. IMPRESSION: Minimally distracted volar plate avulsion fractures of the bases of the third and fourth middle phalanges. Electronically Signed   By: Harrietta Sherry M.D.   On: 09/15/2023 11:32    Procedures Procedures (including critical care time)  Medications Ordered in UC Medications - No data to display  Initial Impression / Assessment and Plan / UC Course  I have reviewed the triage vital signs and the nursing notes.  Pertinent labs & imaging results that were available during my care of the patient were reviewed by me and considered in my medical decision making (see chart for details).     There are fractures of the middle phalanx at the PIP on the palmar portion of both her 3rd and 4th digits of that hand.  Splints are  applied and she is given contact information for hand specialist.  She is also going to follow-up with her primary care to see if she needs to have authorizations done.  She has ibuprofen  at home to take. Final Clinical Impressions(s) / UC Diagnoses   Final diagnoses:   Pain of finger of left hand  Closed nondisplaced fracture of middle phalanx of finger, unspecified finger, initial encounter     Discharge Instructions      You have fractures of the near portion of your middle phalanx of the 3rd and 4th fingers on your left hand.  Continue to take ibuprofen  800 mg that you have at home--800 mg every 8 hours as needed for pain.  Ice and elevation can help  Call your primary care office about this issue to see if they can do an authorization for you to see the hand specialist.       ED Prescriptions   None    PDMP not reviewed this encounter.   Vonna Sharlet POUR, MD 09/15/23 1228

## 2023-09-15 NOTE — Discharge Instructions (Addendum)
 You have fractures of the near portion of your middle phalanx of the 3rd and 4th fingers on your left hand.  Continue to take ibuprofen  800 mg that you have at home--800 mg every 8 hours as needed for pain.  Ice and elevation can help  Call your primary care office about this issue to see if they can do an authorization for you to see the hand specialist.

## 2023-09-18 ENCOUNTER — Telehealth: Payer: Self-pay

## 2023-09-18 NOTE — Telephone Encounter (Signed)
 Call to patient. To advise that she does not need an appointment for [facility] . Unable to reach advised to call us  back so that we can advised.

## 2023-09-29 ENCOUNTER — Other Ambulatory Visit (HOSPITAL_COMMUNITY): Payer: Self-pay

## 2023-10-05 ENCOUNTER — Other Ambulatory Visit (HOSPITAL_COMMUNITY): Payer: Self-pay

## 2023-10-19 ENCOUNTER — Other Ambulatory Visit (HOSPITAL_COMMUNITY): Payer: Self-pay

## 2023-11-13 ENCOUNTER — Other Ambulatory Visit (HOSPITAL_COMMUNITY): Payer: Self-pay

## 2023-11-13 ENCOUNTER — Other Ambulatory Visit: Payer: Self-pay

## 2023-11-13 MED ORDER — DEXAMETHASONE 0.5 MG/5ML PO SOLN
0.7500 mg | Freq: Four times a day (QID) | ORAL | 0 refills | Status: AC
  Filled 2023-11-13 (×2): qty 75, 3d supply, fill #0

## 2023-12-05 ENCOUNTER — Encounter: Payer: BC Managed Care – PPO | Admitting: Family

## 2024-02-02 ENCOUNTER — Ambulatory Visit (HOSPITAL_BASED_OUTPATIENT_CLINIC_OR_DEPARTMENT_OTHER): Admitting: Nurse Practitioner

## 2024-02-02 ENCOUNTER — Encounter: Payer: Self-pay | Admitting: Nurse Practitioner

## 2024-02-02 DIAGNOSIS — J101 Influenza due to other identified influenza virus with other respiratory manifestations: Secondary | ICD-10-CM | POA: Diagnosis not present

## 2024-02-02 MED ORDER — OSELTAMIVIR PHOSPHATE 75 MG PO CAPS: 75.0000 mg | ORAL_CAPSULE | Freq: Two times a day (BID) | ORAL | 0 refills | Status: AC

## 2024-02-02 MED ORDER — PROMETHAZINE-DM 6.25-15 MG/5ML PO SYRP: 5.0000 mL | ORAL_SOLUTION | Freq: Four times a day (QID) | ORAL | 0 refills | Status: AC | PRN

## 2024-02-02 NOTE — Progress Notes (Signed)
 " Virtual Visit Consent   Erin Hanson, you are scheduled for a virtual visit with a [facility] provider today. Just as with appointments in the office, your consent must be obtained to participate. Your consent will be active for this visit and any virtual visit you may have with one of our providers in the next 365 days. If you have a MyChart account, a copy of this consent can be sent to you electronically.  As this is a virtual visit, video technology does not allow for your provider to perform a traditional examination. This may limit your provider's ability to fully assess your condition. If your provider identifies any concerns that need to be evaluated in person or the need to arrange testing (such as labs, EKG, etc.), we will make arrangements to do so. Although advances in technology are sophisticated, we cannot ensure that it will always work on either your end or our end. If the connection with a video visit is poor, the visit may have to be switched to a telephone visit. With either a video or telephone visit, we are not always able to ensure that we have a secure connection.  By engaging in this virtual visit, you consent to the provision of healthcare and authorize for your insurance to be billed (if applicable) for the services provided during this visit. Depending on your insurance coverage, you may receive a charge related to this service.  I need to obtain your verbal consent now. Are you willing to proceed with your visit today? Erin Hanson has provided verbal consent on 02/02/2024 for a virtual visit (video or telephone). Erin LELON Servant, NP  Date: 02/02/2024 11:12 AM   Virtual Visit via Video Note   I, Erin Hanson, connected with  Erin Hanson  ([id number], [birth year]) on 02/02/2024 at 10:50 AM EST by a video-enabled telemedicine application and verified that I am speaking with the correct person using two identifiers.  Location: Patient: Virtual Visit Location  Patient: Home Provider: Virtual Visit Location Provider: Home Office   I discussed the limitations of evaluation and management by telemedicine and the availability of in person appointments. The patient expressed understanding and agreed to proceed.    History of Present Illness: Erin Hanson is a 23 y.o. who identifies as a female who was assigned female at birth, and is being seen today for Flu like symptoms.   2 days ago Erin Hanson started experiencing irritated scratchy throat, dry cough, runny nose, malaise and bodyaches.  States her boyfriend was sick Christmas Eve as well.  She picked up a COVID flu test from the pharmacy today and it is currently positive for influenza A.    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  Resting comfortably at home.  Head is normocephalic, atraumatic.  No labored breathing. Speech is clear and coherent with logical content.  Patient is alert and oriented at baseline.    Assessment and Plan: 1. Influenza A (Primary) - oseltamivir  (TAMIFLU ) 75 MG capsule; Take 1 capsule (75 mg total) by mouth 2 (two) times daily for 5 days.  Dispense: 10 capsule; Refill: 0 - promethazine -dextromethorphan (PROMETHAZINE -DM) 6.25-15 MG/5ML syrup; Take 5 mLs by mouth 4 (four) times daily as needed for cough.  Dispense: 240 mL; Refill: 0  INSTRUCTIONS: use a humidifier for nasal congestion Drink plenty of fluids, rest and wash hands frequently to avoid the spread of infection Alternate tylenol  and Motrin  for relief of fever   Follow Up Instructions: I discussed the assessment and treatment plan with the patient. The patient was provided an opportunity to  ask questions and all were answered. The patient agreed with the plan and demonstrated an understanding of the instructions.  A copy of instructions were sent to the patient via MyChart unless otherwise noted below.     The patient was advised to call back or seek an in-person evaluation if the symptoms worsen or if the condition fails to improve as anticipated.    Nickole Adamek W Laurel Smeltz, NP      [1]  Allergies Allergen Reactions   Lamictal  [Lamotrigine ] Rash  [2]  Current Outpatient Medications:    dexamethasone  (DECADRON ) 0.5 MG/5ML solution, Take 7.5 mLs (0.75 mg total) by mouth every 6 (six) hours for 10 doses., Disp: 75 mL, Rfl: 0   ibuprofen  (ADVIL ) 600 MG tablet, Take 1 tablet (600 mg total) by mouth every 6 (six) hours as needed., Disp: 30 tablet, Rfl: 0   methylphenidate  (CONCERTA ) 18 MG PO CR tablet, Take 1 tablet (18 mg total) by mouth in the morning., Disp: 14 tablet, Rfl: 0   methylphenidate  (CONCERTA ) 27 MG PO CR tablet, Take 1 tablet (27 mg total) by mouth daily., Disp: 30 tablet, Rfl: 0   methylphenidate  (CONCERTA ) 27 MG PO CR tablet, Take 1 tablet (27 mg total) by mouth daily., Disp: 30 tablet, Rfl: 0   methylphenidate  (CONCERTA ) 27 MG PO CR tablet, Take 1 tablet (27 mg total) by mouth daily., Disp: 30 tablet, Rfl: 0   methylphenidate  (CONCERTA ) 27 MG PO CR tablet, Take 1 tablet (27 mg total) by mouth daily., Disp: 30 tablet, Rfl: 0   oseltamivir  (TAMIFLU ) 75 MG capsule, Take 1 capsule (75 mg total) by mouth 2 (two) times daily for 5 days., Disp: 10 capsule, Rfl: 0   promethazine -dextromethorphan (PROMETHAZINE -DM) 6.25-15 MG/5ML syrup, Take 5 mLs by mouth 4 (four) times daily as needed for cough., Disp: 240 mL, Rfl: 0  "

## 2024-03-10 ENCOUNTER — Other Ambulatory Visit (HOSPITAL_COMMUNITY): Payer: Self-pay

## 2024-03-10 ENCOUNTER — Ambulatory Visit (HOSPITAL_COMMUNITY)
Admission: RE | Admit: 2024-03-10 | Discharge: 2024-03-10 | Disposition: A | Discharge status: Home / Self Care | Attending: Internal Medicine

## 2024-03-10 ENCOUNTER — Encounter (HOSPITAL_COMMUNITY): Payer: Self-pay

## 2024-03-10 ENCOUNTER — Other Ambulatory Visit: Payer: Self-pay

## 2024-03-10 VITALS — BP 116/76 | HR 73 | Temp 98.2°F | Resp 18

## 2024-03-10 DIAGNOSIS — K047 Periapical abscess without sinus: Secondary | ICD-10-CM | POA: Diagnosis not present

## 2024-03-10 MED ORDER — AMOXICILLIN-POT CLAVULANATE 875-125 MG PO TABS
1.0000 | ORAL_TABLET | Freq: Two times a day (BID) | ORAL | 0 refills | Status: AC
  Filled 2024-03-10: qty 20, 10d supply, fill #0

## 2024-03-10 MED ORDER — IBUPROFEN 800 MG PO TABS
800.0000 mg | ORAL_TABLET | Freq: Three times a day (TID) | ORAL | 0 refills | Status: AC
  Filled 2024-03-10: qty 21, 7d supply, fill #0

## 2024-03-10 NOTE — Discharge Instructions (Addendum)
 Your dental pain is likely due to dental infection. Take  antibiotic as prescribed for the next 7 days to treat your dental infection. Continue use of over the counter medications as needed with food for dental inflammation and pain like ibuprofen .  Perform salt water gargles every 3-4 hours.  Schedule an appointment with one of the dentists on the list provided to urgent care today.  If you develop any new or worsening symptoms or if your symptoms do not start to improve, pleases return here or follow-up with your primary care provider. If your symptoms are severe, please go to the emergency room.

## 2024-03-10 NOTE — ED Triage Notes (Signed)
 PT reports since Thursday she has had Pin in Lt jaw that radiates to ear and down neck. Pt can not open mouth due to pain.

## 2024-03-10 NOTE — ED Provider Notes (Signed)
 " [facility]    CSN: [id number] Arrival date & time: 03/10/24  1115      History   Chief Complaint Chief Complaint  Patient presents with   Dental Problem    Can't open my mouth more than a half inch. extreme pain radiating from jaw into neck and throat and ear. it's been 3 days of this - Entered by patient   Dental Pain    HPI Erin Hanson is a 24 y.o. female.   Erin Hanson is a 24 y.o. female presenting for chief complaint of dental pain to the left upper mouth/jaw that radiates to the left ear starting 3 days ago. Had bilateral bottom wisdom teeth removed in September 2025, she's concerned her upper wisdom teeth are trying to come in and that's causing swelling/pain. Swelling and pain localized to the left upper posterior mouth. She is experiencing significant pain with opening her jaw and states she has been able to eat/drink soft foods and liquids. Denies fever, chills, nausea, vomiting, body aches, and recent antibiotic use in the last 90 days. States the left jaw feels warm to touch and is very tender. Taking OTC medications without much relief. No recent trauma/injuries to the jaw. Denies throat swelling sensation.    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 Authorizing Provider  amoxicillin -clavulanate (AUGMENTIN ) 875-125 MG tablet Take 1 tablet by mouth every 12 (twelve)  hours for 10 days. 03/10/24 03/20/24 Yes StanhopeDorna HERO, FNP  ibuprofen  (ADVIL ) 800 MG tablet Take 1 tablet (800 mg total) by mouth 3 (three) times daily. 03/10/24  Yes Enedelia Dorna HERO, FNP  dexamethasone  (DECADRON ) 0.5 MG/5ML solution Take 7.5 mLs (0.75 mg total) by mouth every 6 (six) hours for 10 doses. 11/13/23     methylphenidate  (CONCERTA ) 18 MG PO CR tablet Take 1 tablet (18 mg total) by mouth in the morning. 05/30/23     methylphenidate  (CONCERTA ) 27 MG PO CR tablet Take 1 tablet (27 mg total) by mouth daily. 06/14/23     methylphenidate  (CONCERTA ) 27 MG PO CR tablet Take 1 tablet (27 mg total) by mouth daily. 09/13/23     methylphenidate  (CONCERTA ) 27 MG PO CR tablet Take 1 tablet (27 mg total) by mouth daily. 07/19/23     methylphenidate  (CONCERTA ) 27 MG PO CR tablet Take 1 tablet (27 mg total) by mouth daily. 08/16/23     promethazine -dextromethorphan (PROMETHAZINE -DM) 6.25-15 MG/5ML syrup Take 5 mLs by mouth 4 (four) times daily as needed for cough. 02/02/24   Theotis Haze ORN, NP    Family History Family History  Problem Relation Age of Onset   Healthy Mother    Depression Mother    ADD / ADHD Mother    Anxiety disorder Mother    Parkinson's disease Mother    Healthy Father    OCD Father    Depression Father    Anxiety disorder Father    Bipolar disorder Father    Asthma Father    Skin cancer Father    Skin cancer Paternal Uncle    Skin cancer Paternal Grandfather     Social History Social History[1]   Allergies   Lamictal  [lamotrigine ]   Review of Systems Review of Systems Per HPI  Physical Exam Triage Vital Signs ED Triage Vitals  Encounter Vitals Group     BP 03/10/24 1130 116/76     Girls Systolic BP Percentile --      Girls Diastolic BP Percentile --      Boys Systolic BP Percentile --      Boys Diastolic BP Percentile --      Pulse Rate 03/10/24 1130 73     Resp 03/10/24 1130 18     Temp 03/10/24 1130 98.2 F (36.8 C)     Temp src --      SpO2  03/10/24 1130 99 %     Weight --      Height --      Head Circumference --      Peak Flow --      Pain Score 03/10/24 1124 7     Pain Loc --      Pain Education --      Exclude from Growth Chart --    No data found.  Updated Vital Signs BP 116/76   Pulse 73   Temp 98.2 F (36.8 C)   Resp 18   LMP 02/15/2024   SpO2 99%   Visual Acuity Right Eye Distance:   Left Eye Distance:   Bilateral Distance:    Right Eye Near:   Left Eye Near:    Bilateral Near:     Physical Exam Vitals and nursing note reviewed.  Constitutional:      Appearance: She is not ill-appearing or toxic-appearing.  HENT:     Head: Normocephalic and atraumatic.     Jaw: There is normal jaw occlusion. Tenderness, swelling and pain on movement present.      Comments: Minimal decreased ROM of the jaw secondary to pain and swelling.     Right Ear: Hearing, tympanic membrane, ear canal and external ear normal.     Left Ear: Hearing, tympanic membrane, ear canal and external ear normal.     Nose: Nose normal.     Mouth/Throat:     Lips: Pink.     Mouth: Mucous membranes are moist. No injury or oral lesions.     Dentition: Dental tenderness and gingival swelling present. No dental caries.     Tongue: No lesions.     Pharynx: Oropharynx is clear. Uvula midline. No pharyngeal swelling, oropharyngeal exudate, posterior oropharyngeal erythema, uvula swelling or postnasal drip.     Tonsils: No tonsillar exudate.     Comments: Soft tissue swelling to the left upper palate/posterior left upper mouth.  No visible/palpable dental abscess.  Wisdom tooth to the left upper mouth appears to be embedded in the soft tissue. Eyes:     General: Lids are normal. Vision grossly intact. Gaze aligned appropriately.     Extraocular Movements: Extraocular movements intact.     Conjunctiva/sclera: Conjunctivae normal.  Neck:     Trachea: Trachea and phonation normal.  Pulmonary:     Effort: Pulmonary effort is normal.   Musculoskeletal:     Cervical back: Neck supple.  Lymphadenopathy:     Cervical: Cervical adenopathy present.  Skin:    General: Skin is warm and dry.     Capillary Refill: Capillary refill takes less than 2 seconds.     Findings: No rash.  Neurological:     General: No focal deficit present.     Mental Status: She is alert and oriented to person, place, and time. Mental status is at baseline.     Cranial Nerves: No dysarthria or facial asymmetry.  Psychiatric:        Mood and Affect: Mood normal.        Speech: Speech normal.        Behavior: Behavior normal.        Thought Content: Thought content normal.        Judgment: Judgment normal.      UC Treatments / Results  Labs (all labs ordered are listed, but only abnormal results are displayed) Labs Reviewed - No data to display  EKG   Radiology No results found.  Procedures Procedures (including critical care time)  Medications Ordered in UC Medications - No data to display  Initial Impression / Assessment and Plan / UC Course  I have reviewed the triage vital signs and the nursing notes.  Pertinent labs & imaging results that were available during my care of the patient were reviewed by me and considered in my medical decision making (see chart for details).   1.  Dental infection Evaluation suggests dental pain secondary to dental infection.  HEENT exam stable and without red flag signs indicating need for advanced imaging/further emergent workup to rule out deep soft tissue space infection etc.   Antibiotic ordered to treat infection to the mouth.  Recommend supportive care for symptomatic relief as outlined in AVS.   Information for low cost community dental resources provided.  Encouraged to follow-up with dentist for further management.  Counseled patient on potential for adverse effects with medications prescribed/recommended today, strict ER and return-to-clinic precautions discussed, patient  verbalized understanding.    Final Clinical Impressions(s) / UC Diagnoses   Final diagnoses:  Dental infection     Discharge Instructions      Your dental pain is likely due to dental infection. Take  antibiotic as prescribed for the next 7 days to treat your dental infection. Continue use of over the counter medications as needed with food for dental inflammation and pain like ibuprofen .  Perform salt water gargles every 3-4 hours.  Schedule an appointment with one of the dentists on the list provided to urgent care today.  If you develop any new or worsening symptoms or if your symptoms do not start to improve, pleases return here or follow-up with your primary care provider. If your symptoms are severe, please go to the emergency room.      ED Prescriptions     Medication Sig Dispense Auth. Provider   amoxicillin -clavulanate (AUGMENTIN ) 875-125 MG tablet Take 1 tablet by mouth every 12 (twelve) hours for 10 days. 20 tablet Nyzier Boivin M, FNP   ibuprofen  (ADVIL ) 800 MG tablet Take 1 tablet (800 mg total) by mouth 3 (three) times daily. 21 tablet Enedelia Dorna HERO, FNP      PDMP not reviewed this encounter.     [1]  Social History Tobacco Use  Smoking status: Never    Passive exposure: Never   Smokeless tobacco: Never  Vaping Use   Vaping status: Never Used  Substance Use Topics   Alcohol use: Yes    Comment: ocassional   Drug use: Never     Enedelia Dorna HERO, FNP 03/10/24 1232  "
